# Patient Record
Sex: Male | Born: 2000 | Race: White | Hispanic: No | Marital: Single | Smoking: Never smoker
Health system: Southern US, Community
[De-identification: ages and names within clinical notes are randomized; demographics above are authoritative.]

## PROBLEM LIST (undated history)

## (undated) DIAGNOSIS — L309 Dermatitis, unspecified: Secondary | ICD-10-CM

## (undated) DIAGNOSIS — J45909 Unspecified asthma, uncomplicated: Secondary | ICD-10-CM

## (undated) DIAGNOSIS — T7840XA Allergy, unspecified, initial encounter: Secondary | ICD-10-CM

## (undated) HISTORY — DX: Dermatitis, unspecified: L30.9

## (undated) HISTORY — DX: Allergy, unspecified, initial encounter: T78.40XA

## (undated) HISTORY — DX: Unspecified asthma, uncomplicated: J45.909

## (undated) HISTORY — PX: NO PAST SURGERIES: SHX2092

---
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
## 2022-01-08 ENCOUNTER — Ambulatory Visit (INDEPENDENT_AMBULATORY_CARE_PROVIDER_SITE_OTHER): Payer: No Typology Code available for payment source

## 2022-01-08 DIAGNOSIS — J309 Allergic rhinitis, unspecified: Secondary | ICD-10-CM | POA: Diagnosis not present

## 2022-01-18 ENCOUNTER — Other Ambulatory Visit (HOSPITAL_BASED_OUTPATIENT_CLINIC_OR_DEPARTMENT_OTHER): Payer: Self-pay

## 2022-01-18 ENCOUNTER — Other Ambulatory Visit: Payer: Self-pay | Admitting: Allergy

## 2022-01-18 MED ORDER — ALBUTEROL SULFATE HFA 108 (90 BASE) MCG/ACT IN AERS
2.0000 | INHALATION_SPRAY | RESPIRATORY_TRACT | 1 refills | Status: DC | PRN
  Filled 2022-01-18: qty 18, 17d supply, fill #0
  Filled 2022-02-19: qty 18, 17d supply, fill #1

## 2022-01-20 ENCOUNTER — Other Ambulatory Visit (HOSPITAL_BASED_OUTPATIENT_CLINIC_OR_DEPARTMENT_OTHER): Payer: Self-pay

## 2022-01-20 ENCOUNTER — Ambulatory Visit (INDEPENDENT_AMBULATORY_CARE_PROVIDER_SITE_OTHER): Payer: No Typology Code available for payment source

## 2022-01-20 DIAGNOSIS — J309 Allergic rhinitis, unspecified: Secondary | ICD-10-CM

## 2022-02-10 ENCOUNTER — Ambulatory Visit (INDEPENDENT_AMBULATORY_CARE_PROVIDER_SITE_OTHER): Payer: No Typology Code available for payment source

## 2022-02-10 DIAGNOSIS — J309 Allergic rhinitis, unspecified: Secondary | ICD-10-CM | POA: Diagnosis not present

## 2022-02-10 NOTE — Progress Notes (Signed)
EXP 02/11/23 ?

## 2022-02-11 DIAGNOSIS — J3089 Other allergic rhinitis: Secondary | ICD-10-CM | POA: Diagnosis not present

## 2022-02-19 ENCOUNTER — Other Ambulatory Visit (HOSPITAL_BASED_OUTPATIENT_CLINIC_OR_DEPARTMENT_OTHER): Payer: Self-pay

## 2022-02-19 ENCOUNTER — Ambulatory Visit (INDEPENDENT_AMBULATORY_CARE_PROVIDER_SITE_OTHER): Payer: No Typology Code available for payment source

## 2022-02-19 DIAGNOSIS — J309 Allergic rhinitis, unspecified: Secondary | ICD-10-CM

## 2022-02-26 ENCOUNTER — Ambulatory Visit (INDEPENDENT_AMBULATORY_CARE_PROVIDER_SITE_OTHER): Payer: No Typology Code available for payment source

## 2022-02-26 DIAGNOSIS — J309 Allergic rhinitis, unspecified: Secondary | ICD-10-CM | POA: Diagnosis not present

## 2022-03-04 ENCOUNTER — Ambulatory Visit (INDEPENDENT_AMBULATORY_CARE_PROVIDER_SITE_OTHER): Payer: No Typology Code available for payment source

## 2022-03-04 DIAGNOSIS — J309 Allergic rhinitis, unspecified: Secondary | ICD-10-CM | POA: Diagnosis not present

## 2022-03-15 ENCOUNTER — Other Ambulatory Visit (HOSPITAL_BASED_OUTPATIENT_CLINIC_OR_DEPARTMENT_OTHER): Payer: Self-pay

## 2022-03-15 ENCOUNTER — Other Ambulatory Visit: Payer: Self-pay | Admitting: Allergy

## 2022-03-16 ENCOUNTER — Ambulatory Visit (INDEPENDENT_AMBULATORY_CARE_PROVIDER_SITE_OTHER): Payer: No Typology Code available for payment source

## 2022-03-16 ENCOUNTER — Other Ambulatory Visit (HOSPITAL_BASED_OUTPATIENT_CLINIC_OR_DEPARTMENT_OTHER): Payer: Self-pay

## 2022-03-16 DIAGNOSIS — J309 Allergic rhinitis, unspecified: Secondary | ICD-10-CM | POA: Diagnosis not present

## 2022-03-16 MED ORDER — ALBUTEROL SULFATE HFA 108 (90 BASE) MCG/ACT IN AERS
2.0000 | INHALATION_SPRAY | RESPIRATORY_TRACT | 0 refills | Status: DC | PRN
  Filled 2022-03-16: qty 6.7, 25d supply, fill #0

## 2022-03-18 ENCOUNTER — Other Ambulatory Visit (HOSPITAL_BASED_OUTPATIENT_CLINIC_OR_DEPARTMENT_OTHER): Payer: Self-pay

## 2022-03-26 ENCOUNTER — Ambulatory Visit (INDEPENDENT_AMBULATORY_CARE_PROVIDER_SITE_OTHER): Payer: No Typology Code available for payment source

## 2022-03-26 DIAGNOSIS — J309 Allergic rhinitis, unspecified: Secondary | ICD-10-CM

## 2022-04-07 ENCOUNTER — Ambulatory Visit (INDEPENDENT_AMBULATORY_CARE_PROVIDER_SITE_OTHER): Payer: No Typology Code available for payment source

## 2022-04-07 DIAGNOSIS — J309 Allergic rhinitis, unspecified: Secondary | ICD-10-CM

## 2022-04-16 ENCOUNTER — Ambulatory Visit (INDEPENDENT_AMBULATORY_CARE_PROVIDER_SITE_OTHER): Payer: No Typology Code available for payment source | Admitting: Family Medicine

## 2022-04-16 ENCOUNTER — Other Ambulatory Visit (HOSPITAL_BASED_OUTPATIENT_CLINIC_OR_DEPARTMENT_OTHER): Payer: Self-pay

## 2022-04-16 ENCOUNTER — Encounter: Payer: Self-pay | Admitting: Family Medicine

## 2022-04-16 VITALS — BP 116/65 | HR 79 | Ht 76.0 in | Wt 160.0 lb

## 2022-04-16 DIAGNOSIS — J302 Other seasonal allergic rhinitis: Secondary | ICD-10-CM | POA: Diagnosis not present

## 2022-04-16 DIAGNOSIS — J4540 Moderate persistent asthma, uncomplicated: Secondary | ICD-10-CM | POA: Diagnosis not present

## 2022-04-16 DIAGNOSIS — H1010 Acute atopic conjunctivitis, unspecified eye: Secondary | ICD-10-CM | POA: Diagnosis not present

## 2022-04-16 DIAGNOSIS — J3089 Other allergic rhinitis: Secondary | ICD-10-CM | POA: Diagnosis not present

## 2022-04-16 MED ORDER — ALBUTEROL SULFATE HFA 108 (90 BASE) MCG/ACT IN AERS
2.0000 | INHALATION_SPRAY | Freq: Four times a day (QID) | RESPIRATORY_TRACT | 5 refills | Status: DC | PRN
  Filled 2022-04-16: qty 6.7, 25d supply, fill #0
  Filled 2022-06-02: qty 6.7, 25d supply, fill #1
  Filled 2022-06-23: qty 6.7, 25d supply, fill #2
  Filled 2022-07-23: qty 6.7, 25d supply, fill #3
  Filled 2022-08-30: qty 6.7, 25d supply, fill #4
  Filled 2022-11-18: qty 6.7, 25d supply, fill #5

## 2022-04-16 MED ORDER — BUDESONIDE-FORMOTEROL FUMARATE 160-4.5 MCG/ACT IN AERO
INHALATION_SPRAY | RESPIRATORY_TRACT | 11 refills | Status: DC
  Filled 2022-04-16: qty 10.2, 30d supply, fill #0
  Filled 2022-08-30: qty 10.2, 30d supply, fill #1
  Filled 2022-12-16: qty 10.2, 30d supply, fill #2
  Filled 2023-03-25: qty 10.2, 30d supply, fill #3

## 2022-04-16 MED ORDER — MONTELUKAST SODIUM 10 MG PO TABS
10.0000 mg | ORAL_TABLET | Freq: Every day | ORAL | 3 refills | Status: DC
  Filled 2022-04-16: qty 90, 90d supply, fill #0

## 2022-04-16 NOTE — Progress Notes (Signed)
Mark Mckee - 21 y.o. male MRN [id number]  Date of birth: 03-03-2001  Subjective No chief complaint on file.   HPI Mark Mckee is a 21 y.o. male here today for follow up visit.  Overall feeling ok.    Needing refills on medications for asthma.  Asthma has remained well controlled with combination of symbicort and singulair daily.  He has not needed albuterol very often.  Denies shortness of breath, increased wheezing, night time cough.   ROS:  A comprehensive ROS was completed and negative except as noted per HPI  No Known Allergies  Past Medical History:  Diagnosis Date   Allergy    Asthma    Eczema     Past Surgical History:  Procedure Laterality Date   NO PAST SURGERIES      Social History   Socioeconomic History   Marital status: Single    Spouse name: Not on file   Number of children: Not on file   Years of education: Not on file   Highest education level: Not on file  Occupational History   Occupation: Student  Tobacco Use   Smoking status: Passive Smoke Exposure - Never Smoker   Smokeless tobacco: Never  Vaping Use   Vaping Use: Never used  Substance and Sexual Activity   Alcohol use: Never   Drug use: Never   Sexual activity: Never    Birth control/protection: Abstinence  Other Topics Concern   Not on file  Social History Narrative   Not on file   Social Determinants of Health   Financial Resource Strain: Not on file  Food Insecurity: Not on file  Transportation Needs: Not on file  Physical Activity: Not on file  Stress: Not on file  Social Connections: Not on file    Family History  Problem Relation Age of Onset   High blood pressure Maternal Grandfather    Diabetes Maternal Grandfather    Allergic rhinitis Neg Hx    Asthma Neg Hx    Eczema Neg Hx    Urticaria Neg Hx     Health Maintenance  Topic Date Due   HIV Screening  Never done   COVID-19 Vaccine (3 - Pfizer series) 09/12/2020   Hepatitis C Screening  Never done    INFLUENZA VACCINE  06/01/2022   TETANUS/TDAP  01/08/2024   HPV VACCINES  Completed     ----------------------------------------------------------------------------------------------------------------------------------------------------------------------------------------------------------------- Physical Exam BP 116/65 (BP Location: Right Arm, Patient Position: Sitting, Cuff Size: Normal)   Pulse 79   Ht 6\' 4"  (1.93 m)   Wt 160 lb (72.6 kg)   SpO2 100%   BMI 19.48 kg/m   Physical Exam Eyes:     General: No scleral icterus. Cardiovascular:     Rate and Rhythm: Normal rate and regular rhythm.  Pulmonary:     Effort: Pulmonary effort is normal.     Breath sounds: Normal breath sounds.  Neurological:     Mental Status: He is alert.  Psychiatric:        Mood and Affect: Mood normal.        Behavior: Behavior normal.     ------------------------------------------------------------------------------------------------------------------------------------------------------------------------------------------------------------------- Assessment and Plan  Moderate persistent asthma without complication He has been followed by allergist and receiving immunotherapy.  Recommend continuation of this.  Current maintenance medications and rescue inhaler renewed.  Instructed to let me know if needing rescue inhaler more often.    Meds ordered this encounter  Medications   montelukast (SINGULAIR) 10 MG tablet  Sig: Take 1 tablet (10 mg total) by mouth at bedtime.    Dispense:  90 tablet    Refill:  3   budesonide-formoterol (SYMBICORT) 160-4.5 MCG/ACT inhaler    Sig: INHALE 2 PUFFS BY MOUTH INTO THE LUNGS 2 (TWO) TIMES DAILY.    Dispense:  10.2 g    Refill:  11   albuterol (VENTOLIN HFA) 108 (90 Base) MCG/ACT inhaler    Sig: Inhale 2 puffs into the lungs every 6 (six) hours as needed for wheezing or shortness of breath (coughing fits).    Dispense:  6.7 g    Refill:  5    No  follow-ups on file.    This visit occurred during the SARS-CoV-2 public health emergency.  Safety protocols were in place, including screening questions prior to the visit, additional usage of staff PPE, and extensive cleaning of exam room while observing appropriate contact time as indicated for disinfecting solutions.

## 2022-04-16 NOTE — Assessment & Plan Note (Signed)
He has been followed by allergist and receiving immunotherapy.  Recommend continuation of this.  Current maintenance medications and rescue inhaler renewed.  Instructed to let me know if needing rescue inhaler more often.

## 2022-04-16 NOTE — Addendum Note (Signed)
Addended by: Mammie Lorenzo on: 04/16/2022 09:29 AM   Modules accepted: Orders

## 2022-04-22 ENCOUNTER — Other Ambulatory Visit (HOSPITAL_BASED_OUTPATIENT_CLINIC_OR_DEPARTMENT_OTHER): Payer: Self-pay

## 2022-04-27 NOTE — Progress Notes (Signed)
Follow Up Note  RE: Mark Mckee MRN: [id number] DOB: 09/07/2001 Date of Office Visit: 04/28/2022  Referring provider: Everrett Coombe, DO Primary care provider: Everrett Coombe, DO  Chief Complaint: Asthma and Follow-up (Pt states he's doing very well.[name])  History of Present Illness: I had the pleasure of seeing Mark Mckee for a follow up visit at the [facility] on 04/28/2022. He is a 21 y.o. male, who is being followed for asthma and allergic rhinoconjunctivitis on AIT. His previous allergy office visit was on 11/11/2021 with Dr. Selena Batten. Today is a regular follow up visit.  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 During that time he would use albuterol 1-2 times per day at work due to wheezing.   Currently doing Curator work and did not notice any issues with his breathing.  Currently taking Symbicort 2 puffs twice a day and Singulair 10mg  daily.  Seasonal and perennial allergic rhinoconjunctivitis Currently on AIT and noticed some improvement in symptoms. Has 2 dogs and 1 cat and is able to be around them more without major flare in symptoms.   Taking Singulair and zyrtec daily. Only using nasal sprays and eye drops as needed.  Assessment and Plan: Mark Mckee is a 21 y.o. male with: Moderate persistent asthma without complication Past history -  Did not like Breo due to powdery feeling in the mouth. 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 Today's spirometry showed some mild obstruction. IF you have symptoms more than twice per week with your new job then let me know. We will need to adjust your inhaler.  Daily controller medication(s): continue Symbicort Curator 2 puffs twice a day with spacer and rinse mouth afterwards. Continue Singulair 10mg  daily. May use albuterol rescue inhaler 2 puffs every 4 to 6  hours as needed for shortness of breath, chest tightness, coughing, and wheezing. May use albuterol rescue inhaler 2 puffs 5 to 15 minutes prior to strenuous physical activities. Monitor frequency of use.  Get spirometry at next visit.  Seasonal and perennial allergic rhinoconjunctivitis Past history - 2019 skin testing showed: Positive to grass, ragweed, weed, trees, mold, dust mites, dog, cat, cockroach. Flonase caused epistaxis. Started AIT on 03/11/2021 (M-C-D-CR and G-RW-W-DM)  Interim history - symptoms improving with AIT.  Continue environmental control measures. Continue Singulair 10mg  daily. Use over the counter antihistamines such as Zyrtec (cetirizine), Claritin (loratadine), Allegra (fexofenadine), or Xyzal (levocetirizine) daily as needed. May take twice a day during allergy flares. May switch antihistamines every few months. Use Nasacort (triamcinolone) nasal spray 1 spray per nostril twice a day as needed for nasal congestion.  Use azelastine nasal spray 1-2 sprays per nostril twice a day as needed for runny nose/drainage. Nasal saline spray (i.e., Simply Saline) or nasal saline lavage (i.e., NeilMed) is recommended as needed and prior to medicated nasal sprays. May use olopatadine eye drops 0.2% once a day as needed for itchy/watery eyes. Continue allergy injections - given today.  Return in about 4 months (around 08/28/2022).  No orders of the defined types were placed in this encounter.  Lab Orders  No laboratory test(s) ordered today    Diagnostics: Spirometry:  Tracings reviewed. His effort: Good reproducible efforts. FVC: 6.41L FEV1: 4.55L, 82% predicted FEV1/FVC ratio: 71% Interpretation: Spirometry consistent with mild obstructive disease.  Please see scanned spirometry results for details.  Medication List:  Current Outpatient Medications  Medication Sig Dispense Refill   albuterol (VENTOLIN HFA) 108 (90 Base) MCG/ACT inhaler Inhale 2 puffs into the lungs  every 6 (six) hours as needed for wheezing or shortness of breath (coughing fits). 6.7 g 5   budesonide-formoterol (SYMBICORT) 160-4.5 MCG/ACT inhaler INHALE 2 PUFFS BY MOUTH INTO THE LUNGS 2 (TWO) TIMES DAILY. 10.2 g 11   montelukast (SINGULAIR) 10 MG tablet Take 1 tablet (10 mg total) by mouth at bedtime. 90 tablet 3   Olopatadine HCl 0.2 % SOLN Apply 1 drop to eye daily as needed (itch/watery eyes). 2.5 mL 5   triamcinolone (NASACORT) 55 MCG/ACT AERO nasal inhaler Place 1-2 sprays into the nose daily. For nasal congestion 16.9 mL 5   No current facility-administered medications for this visit.   Allergies: No Known Allergies I reviewed his past medical history, social history, family history, and environmental history and no significant changes have been reported from his previous visit.  Review of Systems  Constitutional:  Negative for appetite change, chills, fever and unexpected weight change.  HENT:  Positive for congestion and rhinorrhea. Negative for postnasal drip.   Respiratory:  Negative for cough, chest tightness, shortness of breath and wheezing.   Gastrointestinal:  Negative for abdominal pain.  Skin:  Negative for rash.  Allergic/Immunologic: Positive for environmental allergies.  Neurological:  Negative for headaches.    Objective: BP 116/60   Pulse 61   Temp 98.6 F (37 C) (Temporal)   Resp 17   Ht 6\' 4"  (1.93 m)   Wt 160 lb 3.2 oz (72.7 kg)   SpO2 99%   BMI 19.50 kg/m  Body mass index is 19.5 kg/m. Physical Exam Vitals and nursing note reviewed.  Constitutional:      Appearance: Normal appearance. He is well-developed and normal weight.  HENT:     Head: Normocephalic and atraumatic.     Right Ear: Tympanic membrane and external ear normal.     Left Ear: Tympanic membrane and external ear normal.     Ears:     Comments: Right TM scarring    Nose: Rhinorrhea present.     Mouth/Throat:     Mouth: Mucous membranes are moist.     Pharynx: Oropharynx is  clear.  Eyes:     Conjunctiva/sclera: Conjunctivae normal.  Cardiovascular:     Rate and Rhythm: Normal rate and regular rhythm.     Heart sounds: Normal heart sounds. No murmur heard. Pulmonary:     Effort: Pulmonary effort is normal.     Breath sounds: Normal breath sounds. No wheezing, rhonchi or rales.  Musculoskeletal:     Cervical back: Neck supple.  Skin:    General: Skin is warm.     Findings: No rash.  Neurological:     Mental Status: He is alert and oriented to person, place, and time.  Psychiatric:        Behavior: Behavior normal.    Previous notes and tests were reviewed. The plan was reviewed with the patient/family, and all questions/concerned were addressed.  It was my pleasure to see Mark Mckee today and participate in his care. Please feel free to contact me with any questions or concerns.  Sincerely,  Casimiro Needle, DO Allergy & Immunology  [facility] office: [phone] [facility] office: [phone]

## 2022-04-28 ENCOUNTER — Ambulatory Visit (INDEPENDENT_AMBULATORY_CARE_PROVIDER_SITE_OTHER): Payer: No Typology Code available for payment source | Admitting: Allergy

## 2022-04-28 ENCOUNTER — Encounter: Payer: Self-pay | Admitting: Allergy

## 2022-04-28 VITALS — BP 116/60 | HR 61 | Temp 98.6°F | Resp 17 | Ht 76.0 in | Wt 160.2 lb

## 2022-04-28 DIAGNOSIS — H1013 Acute atopic conjunctivitis, bilateral: Secondary | ICD-10-CM

## 2022-04-28 DIAGNOSIS — J309 Allergic rhinitis, unspecified: Secondary | ICD-10-CM | POA: Diagnosis not present

## 2022-04-28 DIAGNOSIS — J4540 Moderate persistent asthma, uncomplicated: Secondary | ICD-10-CM

## 2022-04-28 DIAGNOSIS — H1010 Acute atopic conjunctivitis, unspecified eye: Secondary | ICD-10-CM

## 2022-04-28 NOTE — Patient Instructions (Addendum)
Moderate persistent asthma  IF you have symptoms more than twice per week with your new job then let me know. We will need to adjust your inhaler.   Daily controller medication(s): continue Symbicort 2 puffs twice a day with spacer and rinse mouth afterwards. Continue Singulair 10mg  daily. May use albuterol rescue inhaler 2 puffs every 4 to 6 hours as needed for shortness of breath, chest tightness, coughing, and wheezing. May use albuterol rescue inhaler 2 puffs 5 to 15 minutes prior to strenuous physical activities. Monitor frequency of use.  Asthma control goals:  Full participation in all desired activities (may need albuterol before activity) Albuterol use two times or less a week on average (not counting use with activity) Cough interfering with sleep two times or less a month Oral steroids no more than once a year  No hospitalizations   Seasonal and perennial allergic rhinitis 2019 skin testing showed: Positive to grass, ragweed, weed, trees, mold, dust mites, dog, cat, cockroach. Continue environmental control measures. Continue Singulair 10mg  daily. Use over the counter antihistamines such as Zyrtec (cetirizine), Claritin (loratadine), Allegra (fexofenadine), or Xyzal (levocetirizine) daily as needed. May take twice a day during allergy flares. May switch antihistamines every few months. Use Nasacort (triamcinolone) nasal spray 1 spray per nostril twice a day as needed for nasal congestion.  Use azelastine nasal spray 1-2 sprays per nostril twice a day as needed for runny nose/drainage. Nasal saline spray (i.e., Simply Saline) or nasal saline lavage (i.e., NeilMed) is recommended as needed and prior to medicated nasal sprays. May use olopatadine eye drops 0.2% once a day as needed for itchy/watery eyes. Continue allergy injections - given today.  Follow up in 4-6 months or sooner if needed.   After September 2023, I will not be in the [facility] office on a regular basis.  You can continue your care and follow up with Dr. 03-20-1986, Dr. [facility] or the nurse practitioners Maurine Minister Ambs or Marlynn Perking) in [facility]. OR you can follow up with me in our [place] ([address]) or [facility] [phone]) location.   Sincerely,  [employer], DO  [facility] office: [phone] [facility] office: [phone] [place] office: [phone]

## 2022-04-28 NOTE — Assessment & Plan Note (Signed)
Past history - 2019 skin testing showed: Positive to grass, ragweed, weed, trees, mold, dust mites, dog, cat, cockroach. Flonase caused epistaxis. Started AIT on 03/11/2021 (M-C-D-CR and G-RW-W-DM)  Interim history - symptoms improving with AIT.   Continue environmental control measures.  Continue Singulair 10mg  daily.  Use over the counter antihistamines such as Zyrtec (cetirizine), Claritin (loratadine), Allegra (fexofenadine), or Xyzal (levocetirizine) daily as needed. May take twice a day during allergy flares. May switch antihistamines every few months.  Use Nasacort (triamcinolone) nasal spray 1 spray per nostril twice a day as needed for nasal congestion.   Use azelastine nasal spray 1-2 sprays per nostril twice a day as needed for runny nose/drainage.  Nasal saline spray (i.e., Simply Saline) or nasal saline lavage (i.e., NeilMed) is recommended as needed and prior to medicated nasal sprays.  May use olopatadine eye drops 0.2% once a day as needed for itchy/watery eyes.  Continue allergy injections - given today.

## 2022-04-28 NOTE — Assessment & Plan Note (Signed)
Past history -  Did not like Breo due to powdery feeling in the mouth. 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  Today's spirometry showed some mild obstruction.  IF you have symptoms more than twice per week with your new job then let me know. We will need to adjust your inhaler.   Daily controller medication(s): continue Symbicort 2 puffs twice a day with spacer and rinse mouth afterwards.  Continue Singulair 10mg  daily. . May use albuterol rescue inhaler 2 puffs every 4 to 6 hours as needed for shortness of breath, chest tightness, coughing, and wheezing. May use albuterol rescue inhaler 2 puffs 5 to 15 minutes prior to strenuous physical activities. Monitor frequency of use.   Get spirometry at next visit.

## 2022-05-13 ENCOUNTER — Ambulatory Visit (INDEPENDENT_AMBULATORY_CARE_PROVIDER_SITE_OTHER): Payer: No Typology Code available for payment source

## 2022-05-13 DIAGNOSIS — J309 Allergic rhinitis, unspecified: Secondary | ICD-10-CM | POA: Diagnosis not present

## 2022-05-28 ENCOUNTER — Ambulatory Visit (INDEPENDENT_AMBULATORY_CARE_PROVIDER_SITE_OTHER): Payer: No Typology Code available for payment source | Admitting: *Deleted

## 2022-05-28 DIAGNOSIS — J309 Allergic rhinitis, unspecified: Secondary | ICD-10-CM | POA: Diagnosis not present

## 2022-06-02 ENCOUNTER — Other Ambulatory Visit (HOSPITAL_BASED_OUTPATIENT_CLINIC_OR_DEPARTMENT_OTHER): Payer: Self-pay

## 2022-06-07 ENCOUNTER — Ambulatory Visit (INDEPENDENT_AMBULATORY_CARE_PROVIDER_SITE_OTHER): Payer: No Typology Code available for payment source

## 2022-06-07 DIAGNOSIS — J309 Allergic rhinitis, unspecified: Secondary | ICD-10-CM

## 2022-06-18 NOTE — Progress Notes (Signed)
EXP 06/22/23 

## 2022-06-23 ENCOUNTER — Other Ambulatory Visit (HOSPITAL_BASED_OUTPATIENT_CLINIC_OR_DEPARTMENT_OTHER): Payer: Self-pay

## 2022-06-25 DIAGNOSIS — J3089 Other allergic rhinitis: Secondary | ICD-10-CM | POA: Diagnosis not present

## 2022-07-01 ENCOUNTER — Encounter: Payer: Self-pay | Admitting: Family Medicine

## 2022-07-01 ENCOUNTER — Ambulatory Visit (INDEPENDENT_AMBULATORY_CARE_PROVIDER_SITE_OTHER): Payer: No Typology Code available for payment source | Admitting: Family Medicine

## 2022-07-01 VITALS — BP 99/51 | HR 69 | Ht 76.0 in | Wt 166.1 lb

## 2022-07-01 DIAGNOSIS — Z0000 Encounter for general adult medical examination without abnormal findings: Secondary | ICD-10-CM | POA: Diagnosis not present

## 2022-07-01 DIAGNOSIS — Z23 Encounter for immunization: Secondary | ICD-10-CM

## 2022-07-01 NOTE — Patient Instructions (Signed)
Preventive Care 19-19 Years Old, Male ?Preventive care refers to lifestyle choices and visits with your health care provider that can promote health and wellness. At this stage in your life, you may start seeing a primary care physician instead of a pediatrician for your preventive care. Preventive care visits are also called wellness exams. ?What can I expect for my preventive care visit? ?Counseling ?During your preventive care visit, your health care provider may ask about your: ?Medical history, including: ?Past medical problems. ?Family medical history. ?Current health, including: ?Home life and relationship well-being. ?Emotional well-being. ?Sexual activity and sexual health. ?Lifestyle, including: ?Alcohol, nicotine or tobacco, and drug use. ?Access to firearms. ?Diet, exercise, and sleep habits. ?Sunscreen use. ?Motor vehicle safety. ?Physical exam ?Your health care provider may check your: ?Height and weight. These may be used to calculate your BMI (body mass index). BMI is a measurement that tells if you are at a healthy weight. ?Waist circumference. This measures the distance around your waistline. This measurement also tells if you are at a healthy weight and may help predict your risk of certain diseases, such as type 2 diabetes and high blood pressure. ?Heart rate and blood pressure. ?Body temperature. ?Skin for abnormal spots. ?What immunizations do I need? ? ?Vaccines are usually given at various ages, according to a schedule. Your health care provider will recommend vaccines for you based on your age, medical history, and lifestyle or other factors, such as travel or where you work. ?What tests do I need? ?Screening ?Your health care provider may recommend screening tests for certain conditions. This may include: ?Vision and hearing tests. ?Lipid and cholesterol levels. ?Hepatitis B test. ?Hepatitis C test. ?HIV (human immunodeficiency virus) test. ?STI (sexually transmitted infection) testing, if  you are at risk. ?Tuberculosis skin test. ?Talk with your health care provider about your test results, treatment options, and if necessary, the need for more tests. ?Follow these instructions at home: ?Eating and drinking ? ?Eat a healthy diet that includes fresh fruits and vegetables, whole grains, lean protein, and low-fat dairy products. ?Drink enough fluid to keep your urine pale yellow. ?Do not drink alcohol if: ?Your health care provider tells you not to drink. ?You are under the legal drinking age. In the [place] the legal drinking age is 21. ?If you drink alcohol: ?Limit how much you have to 0-2 drinks a day. ?Know how much alcohol is in your drink. In the [place] one drink equals one 12 oz bottle of beer (355 mL), one 5 oz glass of wine (148 mL), or one 1? oz glass of hard liquor (44 mL). ?Lifestyle ?Brush your teeth every morning and night with fluoride toothpaste. Floss one time each day. ?Exercise for at least 30 minutes 5 or more days of the week. ?Do not use any products that contain nicotine or tobacco. These products include cigarettes, chewing tobacco, and vaping devices, such as e-cigarettes. If you need help quitting, ask your health care provider. ?Do not use drugs. ?If you are sexually active, practice safe sex. Use a condom or other form of protection to prevent STIs. ?Find healthy ways to manage stress, such as: ?Meditation, yoga, or listening to music. ?Journaling. ?Talking to a trusted person. ?Spending time with friends and family. ?Safety ?Always wear your seat belt while driving or riding in a vehicle. ?Do not drive: ?If you have been drinking alcohol. Do not ride with someone who has been drinking. ?When you are tired or distracted. ?While texting. ?If you have been using   any mind-altering substances or drugs. ?Wear a helmet and other protective equipment during sports activities. ?If you have firearms in your house, make sure you follow all gun safety procedures. ?Seek help if you have  been bullied, physically abused, or sexually abused. ?Use the internet responsibly to avoid dangers, such as online bullying and online sex predators. ?What's next? ?Go to your health care provider once a year for an annual wellness visit. ?Ask your health care provider how often you should have your eyes and teeth checked. ?Stay up to date on all vaccines. ?This information is not intended to replace advice given to you by your health care provider. Make sure you discuss any questions you have with your health care provider. ?Document Revised: 04/15/2021 Document Reviewed: 04/15/2021 ?Elsevier Patient Education ? 2023 Elsevier Inc. ? ?

## 2022-07-01 NOTE — Addendum Note (Signed)
Addended by: Ardyth Man on: 07/01/2022 10:38 AM   Modules accepted: Orders

## 2022-07-01 NOTE — Progress Notes (Signed)
Dexter Signor Hellberg - 21 y.o. male MRN [id number]  Date of birth: 08-Apr-2001  Subjective Chief Complaint  Patient presents with  . Annual Exam    HPI LYTLE MALBURG is a 21 y.o. male here today for annual exam.    He has history of chronic allergies but is otherwise in good health.    He stays fairly active.  Feels that diet is pretty good.   Non-smoker.  Denies EtOH use  He would like flu vaccine today   Review of Systems  Constitutional:  Negative for chills, fever, malaise/fatigue and weight loss.  HENT:  Negative for congestion, ear pain and sore throat.   Eyes:  Negative for blurred vision, double vision and pain.  Respiratory:  Negative for cough and shortness of breath.   Cardiovascular:  Negative for chest pain and palpitations.  Gastrointestinal:  Negative for abdominal pain, blood in stool, constipation, heartburn and nausea.  Genitourinary:  Negative for dysuria and urgency.  Musculoskeletal:  Negative for joint pain and myalgias.  Neurological:  Negative for dizziness and headaches.  Endo/Heme/Allergies:  Does not bruise/bleed easily.  Psychiatric/Behavioral:  Negative for depression. The patient is not nervous/anxious and does not have insomnia.     No Known Allergies  Past Medical History:  Diagnosis Date  . Allergy   . Asthma   . Eczema     Past Surgical History:  Procedure Laterality Date  . NO PAST SURGERIES      Social History   Socioeconomic History  . Marital status: Single    Spouse name: Not on file  . Number of children: Not on file  . Years of education: Not on file  . Highest education level: Not on file  Occupational History  . Occupation: Consulting civil engineer  Tobacco Use  . Smoking status: Never    Passive exposure: Yes  . Smokeless tobacco: Never  Vaping Use  . Vaping Use: Never used  Substance and Sexual Activity  . Alcohol use: Never  . Drug use: Never  . Sexual activity: Never    Birth control/protection: Abstinence  Other Topics  Concern  . Not on file  Social History Narrative  . Not on file   Social Determinants of Health   Financial Resource Strain: Not on file  Food Insecurity: Not on file  Transportation Needs: Not on file  Physical Activity: Not on file  Stress: Not on file  Social Connections: Not on file    Family History  Problem Relation Age of Onset  . High blood pressure Maternal Grandfather   . Diabetes Maternal Grandfather   . Allergic rhinitis Neg Hx   . Asthma Neg Hx   . Eczema Neg Hx   . Urticaria Neg Hx     Health Maintenance  Topic Date Due  . COVID-19 Vaccine (3 - Pfizer series) 07/17/2022 (Originally 09/12/2020)  . INFLUENZA VACCINE  01/30/2023 (Originally 06/01/2022)  . Hepatitis C Screening  04/17/2023 (Originally 11/11/2020)  . HIV Screening  04/17/2023 (Originally 11/11/2017)  . TETANUS/TDAP  01/08/2024  . HPV VACCINES  Completed     ----------------------------------------------------------------------------------------------------------------------------------------------------------------------------------------------------------------- Physical Exam BP (!) 99/51   Pulse 69   Ht 6\' 4"  (1.93 m)   Wt 166 lb 1.9 oz (75.4 kg)   SpO2 99%   BMI 20.22 kg/m   Physical Exam Constitutional:      General: He is not in acute distress. HENT:     Head: Normocephalic and atraumatic.     Right Ear: Tympanic membrane and external  ear normal.     Left Ear: Tympanic membrane and external ear normal.  Eyes:     General: No scleral icterus. Neck:     Thyroid: No thyromegaly.  Cardiovascular:     Rate and Rhythm: Normal rate and regular rhythm.     Heart sounds: Normal heart sounds.  Pulmonary:     Effort: Pulmonary effort is normal.     Breath sounds: Normal breath sounds.  Abdominal:     General: Bowel sounds are normal. There is no distension.     Palpations: Abdomen is soft.     Tenderness: There is no abdominal tenderness. There is no guarding.  Musculoskeletal:      Cervical back: Normal range of motion.  Lymphadenopathy:     Cervical: No cervical adenopathy.  Skin:    General: Skin is warm and dry.     Findings: No rash.  Neurological:     Mental Status: He is alert and oriented to person, place, and time.     Cranial Nerves: No cranial nerve deficit.     Motor: No abnormal muscle tone.  Psychiatric:        Mood and Affect: Mood normal.        Behavior: Behavior normal.    ------------------------------------------------------------------------------------------------------------------------------------------------------------------------------------------------------------------- Assessment and Plan  Well adult exam Well adult Screenings: UTD Immunizations: flu vaccine given today.  Anticipatory guidance/Risk factor reduction:  Recommendations per AVS.    No orders of the defined types were placed in this encounter.   No follow-ups on file.    This visit occurred during the SARS-CoV-2 public health emergency.  Safety protocols were in place, including screening questions prior to the visit, additional usage of staff PPE, and extensive cleaning of exam room while observing appropriate contact time as indicated for disinfecting solutions.  d

## 2022-07-01 NOTE — Assessment & Plan Note (Addendum)
Well adult Screenings: UTD Immunizations: flu vaccine given today.  Anticipatory guidance/Risk factor reduction:  Recommendations per AVS.

## 2022-07-23 ENCOUNTER — Other Ambulatory Visit (HOSPITAL_BASED_OUTPATIENT_CLINIC_OR_DEPARTMENT_OTHER): Payer: Self-pay

## 2022-08-13 ENCOUNTER — Ambulatory Visit: Payer: No Typology Code available for payment source | Admitting: Internal Medicine

## 2022-08-16 ENCOUNTER — Ambulatory Visit: Payer: No Typology Code available for payment source | Admitting: Internal Medicine

## 2022-08-30 ENCOUNTER — Other Ambulatory Visit (HOSPITAL_BASED_OUTPATIENT_CLINIC_OR_DEPARTMENT_OTHER): Payer: Self-pay

## 2022-09-21 ENCOUNTER — Ambulatory Visit: Payer: Self-pay

## 2022-11-18 ENCOUNTER — Other Ambulatory Visit (HOSPITAL_BASED_OUTPATIENT_CLINIC_OR_DEPARTMENT_OTHER): Payer: Self-pay

## 2022-12-16 ENCOUNTER — Other Ambulatory Visit: Payer: Self-pay | Admitting: Family Medicine

## 2022-12-16 ENCOUNTER — Other Ambulatory Visit (HOSPITAL_BASED_OUTPATIENT_CLINIC_OR_DEPARTMENT_OTHER): Payer: Self-pay

## 2022-12-16 MED ORDER — ALBUTEROL SULFATE HFA 108 (90 BASE) MCG/ACT IN AERS
2.0000 | INHALATION_SPRAY | Freq: Four times a day (QID) | RESPIRATORY_TRACT | 5 refills | Status: DC | PRN
  Filled 2022-12-16: qty 6.7, 25d supply, fill #0
  Filled 2023-03-25: qty 6.7, 25d supply, fill #1
  Filled 2023-06-10: qty 6.7, 25d supply, fill #2
  Filled 2023-06-20: qty 6.7, 25d supply, fill #3
  Filled 2023-07-18: qty 6.7, 25d supply, fill #4
  Filled 2023-08-17: qty 6.7, 25d supply, fill #5

## 2023-03-25 ENCOUNTER — Other Ambulatory Visit (HOSPITAL_BASED_OUTPATIENT_CLINIC_OR_DEPARTMENT_OTHER): Payer: Self-pay

## 2023-04-12 ENCOUNTER — Ambulatory Visit (INDEPENDENT_AMBULATORY_CARE_PROVIDER_SITE_OTHER): Payer: Medicaid Other | Admitting: Family Medicine

## 2023-04-12 ENCOUNTER — Encounter: Payer: Self-pay | Admitting: Family Medicine

## 2023-04-12 ENCOUNTER — Other Ambulatory Visit (HOSPITAL_BASED_OUTPATIENT_CLINIC_OR_DEPARTMENT_OTHER): Payer: Self-pay

## 2023-04-12 VITALS — BP 96/56 | HR 66 | Ht 76.0 in | Wt 159.0 lb

## 2023-04-12 DIAGNOSIS — M26609 Unspecified temporomandibular joint disorder, unspecified side: Secondary | ICD-10-CM | POA: Diagnosis not present

## 2023-04-12 MED ORDER — PREDNISONE 10 MG PO TABS
ORAL_TABLET | ORAL | 0 refills | Status: AC
  Filled 2023-04-12: qty 21, 6d supply, fill #0

## 2023-04-12 NOTE — Patient Instructions (Addendum)
Start prednisone taper  Pick up voltaren (diclofenac) and massage into jaw 3-4 times per day.      Temporomandibular Joint Syndrome  Temporomandibular joint syndrome (TMJ syndrome) is a condition that causes pain in the temporomandibular joints. These joints are located near your ears and allow your jaw to open and close. For people with TMJ syndrome, chewing, biting, or other movements of the jaw can be difficult or painful. TMJ syndrome is often mild and goes away within a few weeks. However, sometimes the condition becomes a long-term (chronic) problem. What are the causes? This condition may be caused by: Grinding your teeth or clenching your jaw. Some people do this when they are stressed. Arthritis. An injury to the jaw. A head or neck injury. Teeth or dentures that are not aligned well. In some cases, the cause of TMJ syndrome may not be known. What are the signs or symptoms? The most common symptom of this condition is aching pain on the side of the head in the area of the TMJ. Other symptoms may include: Pain when moving your jaw, such as when chewing or biting. Not being able to open your jaw all the way. Making a clicking sound when you open your mouth. Headache. Earache. Neck or shoulder pain. How is this diagnosed? This condition may be diagnosed based on: Your symptoms and medical history. A physical exam. Your health care provider may check the range of motion of your jaw. Imaging tests, such as X-rays or an MRI. You may also need to see your dentist, who will check if your teeth and jaw are lined up correctly. How is this treated? TMJ syndrome often goes away on its own. If treatment is needed, it may include: Eating soft foods and applying ice or heat. Medicines to relieve pain or inflammation. Medicines or massage to relax the muscles. A splint, bite plate, or mouthpiece to prevent teeth grinding or jaw clenching. Relaxation techniques or counseling to help  reduce stress. A therapy for pain in which an electrical current is applied to the nerves through the skin (transcutaneous electrical nerve stimulation). Acupuncture. This may help to relieve pain. Jaw surgery. This is rarely needed. Follow these instructions at home:  Eating and drinking Eat a soft diet if you are having trouble chewing. Avoid foods that require a lot of chewing. Do not chew gum. General instructions Take over-the-counter and prescription medicines only as told by your health care provider. If directed, put ice on the painful area. To do this: Put ice in a plastic bag. Place a towel between your skin and the bag. Leave the ice on for 20 minutes, 2-3 times a day. Remove the ice if your skin turns bright red. This is very important. If you cannot feel pain, heat, or cold, you have a greater risk of damage to the area. Apply a warm, wet cloth (warm compress) to the painful area as told. Massage your jaw area and do any jaw stretching exercises as told by your health care provider. If you were given a splint, bite plate, or mouthpiece, wear it as told by your health care provider. Keep all follow-up visits. This is important. Where to find more information [employer]: [employer] Contact a health care provider if: You have trouble eating. You have new or worsening symptoms. Get help right away if: Your jaw locks. Summary Temporomandibular joint syndrome (TMJ syndrome) is a condition that causes pain in the temporomandibular joints. These joints are  located near your ears and allow your jaw to open and close. TMJ syndrome is often mild and goes away within a few weeks. However, sometimes the condition becomes a long-term (chronic) problem. Symptoms include an aching pain on the side of the head in the area of the TMJ, pain when chewing or biting, and being unable to open your jaw all the way. You may also make a clicking  sound when you open your mouth. TMJ syndrome often goes away on its own. If treatment is needed, it may include medicines to relieve pain, reduce inflammation, or relax the muscles. A splint, bite plate, or mouthpiece may also be used to prevent teeth grinding or jaw clenching. This information is not intended to replace advice given to you by your health care provider. Make sure you discuss any questions you have with your health care provider. Document Revised: 05/31/2021 Document Reviewed: 05/31/2021 Elsevier Patient Education  2024 [employer].

## 2023-04-12 NOTE — Progress Notes (Signed)
Mark Mckee - 22 y.o. male MRN [id number]  Date of birth: 2001-02-03  Subjective Chief Complaint  Patient presents with   Jaw Pain    HPI Mark Mckee is a 22 y.o. male here today with complaint of jaw pain.  Woke up this morning with pain along the left jaw.  He does not recall any injury.  He is not aware of any issues with grinding his teeth.  He does not have any teeth that are bothering him.  Denies sore throat or difficulty swallowing.  No ear pain or drainage from the ear.   ROS:  A comprehensive ROS was completed and negative except as noted per HPI  No Known Allergies  Past Medical History:  Diagnosis Date   Allergy    Asthma    Eczema     Past Surgical History:  Procedure Laterality Date   NO PAST SURGERIES      Social History   Socioeconomic History   Marital status: Single    Spouse name: Not on file   Number of children: Not on file   Years of education: Not on file   Highest education level: Not on file  Occupational History   Occupation: Student  Tobacco Use   Smoking status: Never    Passive exposure: Yes   Smokeless tobacco: Never  Vaping Use   Vaping Use: Never used  Substance and Sexual Activity   Alcohol use: Never   Drug use: Never   Sexual activity: Never    Birth control/protection: Abstinence  Other Topics Concern   Not on file  Social History Narrative   Not on file   Social Determinants of Health   Financial Resource Strain: Not on file  Food Insecurity: Not on file  Transportation Needs: Not on file  Physical Activity: Not on file  Stress: Not on file  Social Connections: Not on file    Family History  Problem Relation Age of Onset   High blood pressure Maternal Grandfather    Diabetes Maternal Grandfather    Allergic rhinitis Neg Hx    Asthma Neg Hx    Eczema Neg Hx    Urticaria Neg Hx     Health Maintenance  Topic Date Due   COVID-19 Vaccine (3 - 2023-24 season) 07/02/2022   Hepatitis C Screening   04/17/2023 (Originally 11/11/2020)   HIV Screening  04/17/2023 (Originally 11/11/2017)   INFLUENZA VACCINE  06/02/2023   DTaP/Tdap/Td (8 - Td or Tdap) 01/08/2024   HPV VACCINES  Completed     ----------------------------------------------------------------------------------------------------------------------------------------------------------------------------------------------------------------- Physical Exam BP (!) 96/56 (BP Location: Left Arm, Patient Position: Sitting, Cuff Size: Small)   Pulse 66   Ht 6\' 4"  (1.93 m)   Wt 159 lb (72.1 kg)   SpO2 100%   BMI 19.35 kg/m   Physical Exam Constitutional:      Appearance: Normal appearance.  HENT:     Head: Normocephalic and atraumatic.     Right Ear: Tympanic membrane normal.     Left Ear: Tympanic membrane normal.     Mouth/Throat:     Mouth: Mucous membranes are moist.     Pharynx: No oropharyngeal exudate or posterior oropharyngeal erythema.     Comments: Tenderness palpation along the left TMJ joint.  He does have some popping with opening the jaw. Eyes:     General: No scleral icterus. Musculoskeletal:     Cervical back: Neck supple.  Neurological:     Mental Status: He is alert.  Psychiatric:  Mood and Affect: Mood normal.        Behavior: Behavior normal.     ------------------------------------------------------------------------------------------------------------------------------------------------------------------------------------------------------------------- Assessment and Plan  TMJ dysfunction Adding course of prednisone and recommended topical diclofenac gel applied to the area 3-4 times per day.   Meds ordered this encounter  Medications   predniSONE (DELTASONE) 10 MG tablet    Sig: Take 6 tablets (60 mg total) by mouth daily for 1 day, THEN 5 tablets (50 mg total) daily for 1 day, THEN 4 tablets (40 mg total) daily for 1 day, THEN 3 tablets (30 mg total) daily for 1 day, THEN 2 tablets (20 mg  total) daily for 1 day, THEN 1 tablet (10 mg total) daily for 1 day.    Dispense:  21 tablet    Refill:  0    No follow-ups on file.    This visit occurred during the SARS-CoV-2 public health emergency.  Safety protocols were in place, including screening questions prior to the visit, additional usage of staff PPE, and extensive cleaning of exam room while observing appropriate contact time as indicated for disinfecting solutions.

## 2023-04-12 NOTE — Assessment & Plan Note (Signed)
Adding course of prednisone and recommended topical diclofenac gel applied to the area 3-4 times per day.

## 2023-06-10 ENCOUNTER — Other Ambulatory Visit (HOSPITAL_BASED_OUTPATIENT_CLINIC_OR_DEPARTMENT_OTHER): Payer: Self-pay

## 2023-06-20 ENCOUNTER — Other Ambulatory Visit (HOSPITAL_BASED_OUTPATIENT_CLINIC_OR_DEPARTMENT_OTHER): Payer: Self-pay

## 2023-07-18 ENCOUNTER — Other Ambulatory Visit (HOSPITAL_BASED_OUTPATIENT_CLINIC_OR_DEPARTMENT_OTHER): Payer: Self-pay

## 2023-07-25 ENCOUNTER — Other Ambulatory Visit (HOSPITAL_BASED_OUTPATIENT_CLINIC_OR_DEPARTMENT_OTHER): Payer: Self-pay

## 2023-07-25 MED ORDER — CIPROFLOXACIN HCL 0.3 % OP SOLN
1.0000 [drp] | OPHTHALMIC | 0 refills | Status: DC
  Filled 2023-07-25 (×2): qty 5, 10d supply, fill #0

## 2023-07-25 MED ORDER — CIPROFLOXACIN HCL 0.3 % OP SOLN: 1.0000 [drp] | OPHTHALMIC | 0 refills | Status: DC

## 2023-08-17 ENCOUNTER — Telehealth: Payer: Self-pay | Admitting: Family Medicine

## 2023-08-17 ENCOUNTER — Other Ambulatory Visit: Payer: Self-pay | Admitting: Family Medicine

## 2023-08-17 ENCOUNTER — Other Ambulatory Visit (HOSPITAL_BASED_OUTPATIENT_CLINIC_OR_DEPARTMENT_OTHER): Payer: Self-pay

## 2023-08-17 DIAGNOSIS — J3089 Other allergic rhinitis: Secondary | ICD-10-CM

## 2023-08-17 DIAGNOSIS — J4540 Moderate persistent asthma, uncomplicated: Secondary | ICD-10-CM

## 2023-08-17 MED ORDER — BUDESONIDE-FORMOTEROL FUMARATE 160-4.5 MCG/ACT IN AERO
2.0000 | INHALATION_SPRAY | Freq: Two times a day (BID) | RESPIRATORY_TRACT | 11 refills | Status: DC
  Filled 2023-08-17: qty 10.2, 30d supply, fill #0

## 2023-08-17 NOTE — Telephone Encounter (Signed)
Patient is requesting that all medications been sent to [facility] [place]  Phone [phone]

## 2023-08-18 ENCOUNTER — Other Ambulatory Visit: Payer: Self-pay | Admitting: Family Medicine

## 2023-08-18 ENCOUNTER — Other Ambulatory Visit (HOSPITAL_BASED_OUTPATIENT_CLINIC_OR_DEPARTMENT_OTHER): Payer: Self-pay

## 2023-08-18 MED ORDER — MONTELUKAST SODIUM 10 MG PO TABS: 10.0000 mg | ORAL_TABLET | Freq: Every day | ORAL | 3 refills | Status: DC

## 2023-08-18 MED ORDER — BUDESONIDE-FORMOTEROL FUMARATE 160-4.5 MCG/ACT IN AERO: 2.0000 | INHALATION_SPRAY | Freq: Two times a day (BID) | RESPIRATORY_TRACT | 11 refills | Status: DC

## 2023-08-18 MED ORDER — ALBUTEROL SULFATE HFA 108 (90 BASE) MCG/ACT IN AERS: 2.0000 | INHALATION_SPRAY | Freq: Four times a day (QID) | RESPIRATORY_TRACT | 5 refills | Status: DC | PRN

## 2023-08-18 NOTE — Telephone Encounter (Signed)
Completed.

## 2023-08-19 ENCOUNTER — Other Ambulatory Visit (HOSPITAL_BASED_OUTPATIENT_CLINIC_OR_DEPARTMENT_OTHER): Payer: Self-pay

## 2023-08-23 ENCOUNTER — Other Ambulatory Visit: Payer: Self-pay

## 2023-08-23 DIAGNOSIS — J4540 Moderate persistent asthma, uncomplicated: Secondary | ICD-10-CM

## 2023-08-23 MED ORDER — BUDESONIDE-FORMOTEROL FUMARATE 160-4.5 MCG/ACT IN AERO: 2.0000 | INHALATION_SPRAY | Freq: Two times a day (BID) | RESPIRATORY_TRACT | 6 refills | Status: DC

## 2023-08-23 MED ORDER — ALBUTEROL SULFATE HFA 108 (90 BASE) MCG/ACT IN AERS: 2.0000 | INHALATION_SPRAY | Freq: Four times a day (QID) | RESPIRATORY_TRACT | 5 refills | Status: DC | PRN

## 2023-09-23 ENCOUNTER — Telehealth: Payer: Self-pay | Admitting: Family Medicine

## 2023-09-23 ENCOUNTER — Other Ambulatory Visit (HOSPITAL_BASED_OUTPATIENT_CLINIC_OR_DEPARTMENT_OTHER): Payer: Self-pay

## 2023-09-23 ENCOUNTER — Other Ambulatory Visit: Payer: Self-pay | Admitting: Family Medicine

## 2023-09-23 MED ORDER — ALBUTEROL SULFATE HFA 108 (90 BASE) MCG/ACT IN AERS
2.0000 | INHALATION_SPRAY | Freq: Four times a day (QID) | RESPIRATORY_TRACT | 5 refills | Status: DC | PRN
  Filled 2023-09-23: qty 6.7, 25d supply, fill #0

## 2023-09-23 NOTE — Telephone Encounter (Signed)
Called patient left vm to call back and reschedule appointment on 11-04-23 Dr. Ashley Royalty is out of the office

## 2023-09-27 ENCOUNTER — Other Ambulatory Visit: Payer: Self-pay

## 2023-10-04 ENCOUNTER — Other Ambulatory Visit (HOSPITAL_BASED_OUTPATIENT_CLINIC_OR_DEPARTMENT_OTHER): Payer: Self-pay

## 2023-10-13 ENCOUNTER — Other Ambulatory Visit: Payer: Self-pay

## 2023-10-13 DIAGNOSIS — J3089 Other allergic rhinitis: Secondary | ICD-10-CM

## 2023-10-13 MED ORDER — MONTELUKAST SODIUM 10 MG PO TABS: 10.0000 mg | ORAL_TABLET | Freq: Every day | ORAL | 3 refills | Status: DC

## 2023-10-13 MED ORDER — ALBUTEROL SULFATE HFA 108 (90 BASE) MCG/ACT IN AERS: 2.0000 | INHALATION_SPRAY | Freq: Four times a day (QID) | RESPIRATORY_TRACT | 5 refills | Status: DC | PRN

## 2023-10-28 ENCOUNTER — Other Ambulatory Visit: Payer: Self-pay

## 2023-11-04 ENCOUNTER — Encounter: Payer: Medicaid Other | Admitting: Family Medicine

## 2023-12-15 ENCOUNTER — Ambulatory Visit (INDEPENDENT_AMBULATORY_CARE_PROVIDER_SITE_OTHER): Payer: Self-pay | Admitting: Family Medicine

## 2023-12-15 ENCOUNTER — Encounter: Payer: Self-pay | Admitting: Family Medicine

## 2023-12-15 VITALS — BP 107/63 | HR 68 | Ht 76.0 in | Wt 179.0 lb

## 2023-12-15 DIAGNOSIS — J3089 Other allergic rhinitis: Secondary | ICD-10-CM | POA: Diagnosis not present

## 2023-12-15 DIAGNOSIS — Z0000 Encounter for general adult medical examination without abnormal findings: Secondary | ICD-10-CM

## 2023-12-15 DIAGNOSIS — Z23 Encounter for immunization: Secondary | ICD-10-CM | POA: Diagnosis not present

## 2023-12-15 DIAGNOSIS — R41840 Attention and concentration deficit: Secondary | ICD-10-CM

## 2023-12-15 DIAGNOSIS — J4540 Moderate persistent asthma, uncomplicated: Secondary | ICD-10-CM | POA: Diagnosis not present

## 2023-12-15 MED ORDER — ALBUTEROL SULFATE HFA 108 (90 BASE) MCG/ACT IN AERS: 2.0000 | INHALATION_SPRAY | Freq: Four times a day (QID) | RESPIRATORY_TRACT | 5 refills | Status: DC | PRN

## 2023-12-15 MED ORDER — BUDESONIDE-FORMOTEROL FUMARATE 160-4.5 MCG/ACT IN AERO: 2.0000 | INHALATION_SPRAY | Freq: Two times a day (BID) | RESPIRATORY_TRACT | 6 refills | Status: AC

## 2023-12-15 MED ORDER — MONTELUKAST SODIUM 10 MG PO TABS: 10.0000 mg | ORAL_TABLET | Freq: Every day | ORAL | 3 refills | Status: AC

## 2023-12-15 NOTE — Progress Notes (Signed)
Mark Mckee - 23 y.o. male MRN [id number]  Date of birth: [birth year]  Subjective Chief Complaint  Patient presents with   Annual Exam    HPI Mark Mckee is a 23 y.o. male here today for annual exam.    He is interested in having evaluation for ADD.  He has had symptoms consistent with ADD for several years but never tested during childhood.   He pretty active.  He feels that diet is pretty good.   He is a non-smoker.  Does not vape.  He is rarely consumes EtOH.   Review of Systems  Constitutional:  Negative for chills, fever, malaise/fatigue and weight loss.  HENT:  Negative for congestion, ear pain and sore throat.   Eyes:  Negative for blurred vision, double vision and pain.  Respiratory:  Negative for cough and shortness of breath.   Cardiovascular:  Negative for chest pain and palpitations.  Gastrointestinal:  Negative for abdominal pain, blood in stool, constipation, heartburn and nausea.  Genitourinary:  Negative for dysuria and urgency.  Musculoskeletal:  Negative for joint pain and myalgias.  Neurological:  Negative for dizziness and headaches.  Endo/Heme/Allergies:  Does not bruise/bleed easily.  Psychiatric/Behavioral:  Negative for depression. The patient is not nervous/anxious and does not have insomnia.     No Known Allergies  Past Medical History:  Diagnosis Date   Allergy    Asthma    Eczema     Past Surgical History:  Procedure Laterality Date   NO PAST SURGERIES      Social History   Socioeconomic History   Marital status: Single    Spouse name: Not on file   Number of children: Not on file   Years of education: Not on file   Highest education level: Not on file  Occupational History   Occupation: Student  Tobacco Use   Smoking status: Never    Passive exposure: Yes   Smokeless tobacco: Never  Vaping Use   Vaping status: Never Used  Substance and Sexual Activity   Alcohol use: Never   Drug use: Never   Sexual activity: Never     Birth control/protection: Abstinence  Other Topics Concern   Not on file  Social History Narrative   Not on file   Social Drivers of Health   Financial Resource Strain: Not on file  Food Insecurity: Not on file  Transportation Needs: Not on file  Physical Activity: Not on file  Stress: Not on file  Social Connections: Not on file    Family History  Problem Relation Age of Onset   High blood pressure Maternal Grandfather    Diabetes Maternal Grandfather    Allergic rhinitis Neg Hx    Asthma Neg Hx    Eczema Neg Hx    Urticaria Neg Hx     Health Maintenance  Topic Date Due   Pneumococcal Vaccine 50-58 Years old (1 of 1 - PPSV23 or PCV20) 11/11/2008   HIV Screening  Never done   Hepatitis C Screening  Never done   COVID-19 Vaccine (3 - 2024-25 season) 07/03/2023   INFLUENZA VACCINE  01/30/2024 (Originally 06/02/2023)   DTaP/Tdap/Td (8 - Td or Tdap) 01/08/2024   HPV VACCINES  Completed     ----------------------------------------------------------------------------------------------------------------------------------------------------------------------------------------------------------------- Physical Exam BP 107/63 (BP Location: Left Arm, Patient Position: Sitting, Cuff Size: Normal)   Pulse 68   Ht 6\' 4"  (1.93 m)   Wt 179 lb (81.2 kg)   SpO2 100%   BMI 21.79 kg/m  Physical Exam Constitutional:      General: He is not in acute distress. HENT:     Head: Normocephalic and atraumatic.     Right Ear: Tympanic membrane and external ear normal.     Left Ear: Tympanic membrane and external ear normal.  Eyes:     General: No scleral icterus. Neck:     Thyroid: No thyromegaly.  Cardiovascular:     Rate and Rhythm: Normal rate and regular rhythm.     Heart sounds: Normal heart sounds.  Pulmonary:     Effort: Pulmonary effort is normal.     Breath sounds: Normal breath sounds.  Abdominal:     General: Bowel sounds are normal. There is no distension.      Palpations: Abdomen is soft.     Tenderness: There is no abdominal tenderness. There is no guarding.  Musculoskeletal:     Cervical back: Normal range of motion.  Lymphadenopathy:     Cervical: No cervical adenopathy.  Skin:    General: Skin is warm and dry.     Findings: No rash.  Neurological:     Mental Status: He is alert and oriented to person, place, and time.     Cranial Nerves: No cranial nerve deficit.     Motor: No abnormal muscle tone.  Psychiatric:        Mood and Affect: Mood normal.        Behavior: Behavior normal.     ------------------------------------------------------------------------------------------------------------------------------------------------------------------------------------------------------------------- Assessment and Plan  Well adult exam Well adult Screenings: UTD Immunizations: Prevnar 20 Anticipatory guidance/Risk factor reduction:  Recommendations per AVS.   Decreased attention Span Referral to psychology for further testing.    Meds ordered this encounter  Medications   albuterol (VENTOLIN HFA) 108 (90 Base) MCG/ACT inhaler    Sig: Inhale 2 puffs into the lungs every 6 (six) hours as needed for wheezing or shortness of breath (coughing fits).    Dispense:  6.7 g    Refill:  5   budesonide-formoterol (SYMBICORT) 160-4.5 MCG/ACT inhaler    Sig: Inhale 2 puffs into the lungs 2 (two) times daily.    Dispense:  10.2 g    Refill:  6   montelukast (SINGULAIR) 10 MG tablet    Sig: Take 1 tablet (10 mg total) by mouth at bedtime.    Dispense:  90 tablet    Refill:  3    No follow-ups on file.    This visit occurred during the SARS-CoV-2 public health emergency.  Safety protocols were in place, including screening questions prior to the visit, additional usage of staff PPE, and extensive cleaning of exam room while observing appropriate contact time as indicated for disinfecting solutions.

## 2023-12-15 NOTE — Addendum Note (Signed)
Addended by: Ardyth Man on: 12/15/2023 04:08 PM   Modules accepted: Orders

## 2023-12-15 NOTE — Assessment & Plan Note (Signed)
Well adult Screenings: UTD Immunizations: Prevnar 20 Anticipatory guidance/Risk factor reduction:  Recommendations per AVS.

## 2023-12-15 NOTE — Assessment & Plan Note (Signed)
Referral to psychology for further testing.

## 2023-12-15 NOTE — Patient Instructions (Signed)
any mind-altering substances or drugs. Wear a helmet and other protective equipment during sports activities. If you have firearms in your house, make sure you follow all gun safety procedures. Seek help if you have  been bullied, physically abused, or sexually abused. Use the internet responsibly to avoid dangers, such as online bullying and online sex predators. What's next? Go to your health care provider once a year for an annual wellness visit. Ask your health care provider how often you should have your eyes and teeth checked. Stay up to date on all vaccines. This information is not intended to replace advice given to you by your health care provider. Make sure you discuss any questions you have with your health care provider. Document Revised: 04/15/2021 Document Reviewed: 04/15/2021 Elsevier Patient Education  2024 [employer].

## 2024-04-17 DIAGNOSIS — M25471 Effusion, right ankle: Secondary | ICD-10-CM | POA: Diagnosis not present

## 2024-04-23 DIAGNOSIS — S93411D Sprain of calcaneofibular ligament of right ankle, subsequent encounter: Secondary | ICD-10-CM | POA: Diagnosis not present

## 2024-07-20 ENCOUNTER — Telehealth: Admitting: Physician Assistant

## 2024-07-20 ENCOUNTER — Other Ambulatory Visit: Payer: Self-pay | Admitting: Family Medicine

## 2024-07-20 DIAGNOSIS — J4521 Mild intermittent asthma with (acute) exacerbation: Secondary | ICD-10-CM | POA: Diagnosis not present

## 2024-07-20 MED ORDER — ALBUTEROL SULFATE HFA 108 (90 BASE) MCG/ACT IN AERS: 2.0000 | INHALATION_SPRAY | Freq: Four times a day (QID) | RESPIRATORY_TRACT | 2 refills | Status: AC | PRN

## 2024-07-20 NOTE — Telephone Encounter (Signed)
 Copied from CRM [id number]. Topic: Clinical - Medication Refill >> Jul 20, 2024  4:54 PM Kevelyn M wrote: Medication: albuterol  (VENTOLIN  HFA) 108 (90 Base) MCG/ACT inhaler  Has the patient contacted their pharmacy? Yes (Agent: If no, request that the patient contact the pharmacy for the refill. If patient does not wish to contact the pharmacy document the reason why and proceed with request.) (Agent: If yes, when and what did the pharmacy advise?)  This is the patient's preferred pharmacy:   [facility] [phone] - [place], [place] - [address] [place] [address] Phone: [phone] Fax: [phone]  Is this the correct pharmacy for this prescription? Yes If no, delete pharmacy and type the correct one.   Has the prescription been filled recently? No  Is the patient out of the medication? Yes  Has the patient been seen for an appointment in the last year OR does the patient have an upcoming appointment? Yes  Can we respond through MyChart? Yes  Agent: Please be advised that Rx refills may take up to 3 business days. We ask that you follow-up with your pharmacy.

## 2024-07-20 NOTE — Progress Notes (Signed)

## 2024-10-09 ENCOUNTER — Encounter: Payer: Self-pay | Admitting: Family Medicine

## 2024-10-09 ENCOUNTER — Ambulatory Visit: Admitting: Family Medicine

## 2024-10-09 VITALS — BP 111/64 | HR 68 | Ht 76.0 in | Wt 178.0 lb

## 2024-10-09 DIAGNOSIS — H66002 Acute suppurative otitis media without spontaneous rupture of ear drum, left ear: Secondary | ICD-10-CM

## 2024-10-09 DIAGNOSIS — H6640 Suppurative otitis media, unspecified, unspecified ear: Secondary | ICD-10-CM | POA: Insufficient documentation

## 2024-10-09 DIAGNOSIS — Z23 Encounter for immunization: Secondary | ICD-10-CM

## 2024-10-09 MED ORDER — AMOXICILLIN-POT CLAVULANATE 875-125 MG PO TABS: 1.0000 | ORAL_TABLET | Freq: Two times a day (BID) | ORAL | 0 refills | Status: AC

## 2024-10-09 NOTE — Progress Notes (Signed)
 Mark Mckee - 23 y.o. male MRN [id number]  Date of birth: 10/11/2001  Subjective Chief Complaint  Patient presents with   Ear Pain    HPI Mark Mckee is a 23 y.o. male here today with complaint of L ear pain. Symptoms started yesterday.  He does report having recurrent OM as a child. Denies fever, chills, headache or sinus pain.  He has not tried anything for management of symptoms so far.   ROS:  A comprehensive ROS was completed and negative except as noted per HPI  No Known Allergies  Past Medical History:  Diagnosis Date   Allergy    Asthma    Eczema     Past Surgical History:  Procedure Laterality Date   NO PAST SURGERIES      Social History   Socioeconomic History   Marital status: Single    Spouse name: Not on file   Number of children: Not on file   Years of education: Not on file   Highest education level: Not on file  Occupational History   Occupation: Student  Tobacco Use   Smoking status: Never    Passive exposure: Yes   Smokeless tobacco: Never  Vaping Use   Vaping status: Never Used  Substance and Sexual Activity   Alcohol use: Never   Drug use: Never   Sexual activity: Never    Birth control/protection: Abstinence  Other Topics Concern   Not on file  Social History Narrative   Not on file   Social Drivers of Health   Financial Resource Strain: Low Risk  (10/09/2024)   Overall Financial Resource Strain (CARDIA)    Difficulty of Paying Living Expenses: Not hard at all  Food Insecurity: Unknown (04/16/2024)   Received from [facility]   Hunger Vital Sign    Within the past 12 months, you worried that your food would run out before you got money to buy more: Patient declined to answer    Within the past 12 months, the food you bought just didn't last and you didn't have money to get more. : Patient declined to answer  Transportation Needs: No Transportation Needs (04/16/2024)   Received from [facility]    In the  past 12 months, has lack of reliable transportation kept you from medical appointments, meetings, work or from getting things needed for daily living? : No  Physical Activity: Sufficiently Active (10/09/2024)   Exercise Vital Sign    Days of Exercise per Week: 5 days    Minutes of Exercise per Session: 150+ min  Stress: No Stress Concern Present (10/09/2024)   [employer] of Occupational Health - Occupational Stress Questionnaire    Feeling of Stress: Only a little  Social Connections: Socially Isolated (10/09/2024)   Social Connection and Isolation Panel    Frequency of Communication with Friends and Family: Three times a week    Frequency of Social Gatherings with Friends and Family: Once a week    Attends Religious Services: Never    Database Administrator or Organizations: No    Attends Engineer, Structural: Never    Marital Status: Never married    Family History  Problem Relation Age of Onset   High blood pressure Maternal Grandfather    Diabetes Maternal Grandfather    Allergic rhinitis Neg Hx    Asthma Neg Hx    Eczema Neg Hx    Urticaria Neg Hx     Health Maintenance  Topic  Date Due   HIV Screening  Never done   Hepatitis C Screening  Never done   DTaP/Tdap/Td (8 - Td or Tdap) 01/08/2024   Influenza Vaccine  01/29/2025 (Originally 06/01/2024)   COVID-19 Vaccine (4 - 2025-26 season) 10/25/2025 (Originally 07/02/2024)   Pneumococcal Vaccine  Completed   Hepatitis B Vaccines 19-59 Average Risk  Completed   HPV VACCINES  Completed   Meningococcal B Vaccine  Completed     ----------------------------------------------------------------------------------------------------------------------------------------------------------------------------------------------------------------- Physical Exam BP (!) 92/58 (BP Location: Left Arm, Patient Position: Sitting, Cuff Size: Normal)   Pulse 68   Ht 6' 4 (1.93 m)   Wt 178 lb (80.7 kg)   SpO2 100%   BMI 21.67 kg/m    Physical Exam Constitutional:      Appearance: Normal appearance.  HENT:     Right Ear: Tympanic membrane and ear canal normal.     Left Ear: A middle ear effusion is present. Tympanic membrane is injected.  Eyes:     General: No scleral icterus. Cardiovascular:     Rate and Rhythm: Normal rate and regular rhythm.  Pulmonary:     Effort: Pulmonary effort is normal.     Breath sounds: Normal breath sounds.  Musculoskeletal:     Cervical back: Neck supple.  Lymphadenopathy:     Cervical: No cervical adenopathy.  Neurological:     Mental Status: He is alert.  Psychiatric:        Mood and Affect: Mood normal.        Behavior: Behavior normal.     ------------------------------------------------------------------------------------------------------------------------------------------------------------------------------------------------------------------- Assessment and Plan  Suppurative otitis media Treating with course of augmentin .  He may continue symptomatic and supportive care.  Red flags reviewed.   Meds ordered this encounter  Medications   amoxicillin -clavulanate (AUGMENTIN ) 875-125 MG tablet    Sig: Take 1 tablet by mouth 2 (two) times daily.    Dispense:  20 tablet    Refill:  0    No follow-ups on file.

## 2024-10-09 NOTE — Assessment & Plan Note (Signed)
 Treating with course of augmentin .  He may continue symptomatic and supportive care.  Red flags reviewed.

## 2024-10-09 NOTE — Patient Instructions (Signed)
 Otitis Media, Adult    Otitis media is a condition in which the middle ear is red and swollen (inflamed) and full of fluid. The middle ear is the part of the ear that contains bones for hearing as well as air that helps send sounds to the brain. The condition usually goes away on its own.  What are the causes?  This condition is caused by a blockage in the eustachian tube. This tube connects the middle ear to the back of the nose. It normally allows air into the middle ear. The blockage is caused by fluid or swelling. Problems that can cause blockage include:  A cold or infection that affects the nose, mouth, or throat.  Allergies.  An irritant, such as tobacco smoke.  Adenoids that have become large. The adenoids are soft tissue located in the back of the throat, behind the nose and the roof of the mouth.  Growth or swelling in the upper part of the throat, just behind the nose (nasopharynx).  Damage to the ear caused by a change in pressure. This is called barotrauma.  What increases the risk?  You are more likely to develop this condition if you:  Smoke or are exposed to tobacco smoke.  Have an opening in the roof of your mouth (cleft palate).  Have acid reflux.  Have problems in your body's defense system (immune system).  What are the signs or symptoms?  Symptoms of this condition include:  Ear pain.  Fever.  Problems with hearing.  Being tired.  Fluid leaking from the ear.  Ringing in the ear.  How is this treated?  This condition can go away on its own within 3-5 days. But if the condition is caused by germs (bacteria) and does not go away on its own, or if it keeps coming back, your doctor may:  Give you antibiotic medicines.  Give you medicines for pain.  Follow these instructions at home:  Take over-the-counter and prescription medicines only as told by your doctor.  If you were prescribed an antibiotic medicine, take it as told by your doctor. Do not stop taking it even if you start to feel better.  Keep  all follow-up visits.  Contact a doctor if:  You have bleeding from your nose.  There is a lump on your neck.  You are not feeling better in 5 days.  You feel worse instead of better.  Get help right away if:  You have pain that is not helped with medicine.  You have swelling, redness, or pain around your ear.  You get a stiff neck.  You cannot move part of your face (paralysis).  You notice that the bone behind your ear hurts when you touch it.  You get a very bad headache.  Summary  Otitis media means that the middle ear is red, swollen, and full of fluid.  This condition usually goes away on its own.  If the problem does not go away, treatment may be needed. You may be given medicines to treat the infection or to treat your pain.  If you were prescribed an antibiotic medicine, take it as told by your doctor. Do not stop taking it even if you start to feel better.  Keep all follow-up visits.  This information is not intended to replace advice given to you by your health care provider. Make sure you discuss any questions you have with your health care provider.  Document Revised: 01/26/2021 Document Reviewed: 01/26/2021  [employer]vier  Patient Education  2024 [employer].

## 2024-10-11 ENCOUNTER — Encounter: Payer: Self-pay | Admitting: Family Medicine

## 2024-10-11 ENCOUNTER — Other Ambulatory Visit: Payer: Self-pay | Admitting: Family Medicine

## 2024-10-11 MED ORDER — ALBUTEROL SULFATE HFA 108 (90 BASE) MCG/ACT IN AERS: 2.0000 | INHALATION_SPRAY | Freq: Four times a day (QID) | RESPIRATORY_TRACT | 5 refills | Status: AC | PRN
